# Patient Record
Sex: Male | Born: 1950 | Race: White | Hispanic: No | Marital: Married
Health system: Southern US, Community
[De-identification: ages and names within clinical notes are randomized; demographics above are authoritative.]

---

## 2003-04-02 ENCOUNTER — Ambulatory Visit (HOSPITAL_COMMUNITY): Admission: RE | Admit: 2003-04-02 | Discharge: 2003-04-02 | Payer: Self-pay | Admitting: Gastroenterology

## 2006-03-25 ENCOUNTER — Emergency Department (HOSPITAL_COMMUNITY): Admission: EM | Admit: 2006-03-25 | Discharge: 2006-03-25 | Payer: Self-pay | Admitting: Emergency Medicine

## 2006-06-08 ENCOUNTER — Inpatient Hospital Stay (HOSPITAL_COMMUNITY): Admission: EM | Admit: 2006-06-08 | Discharge: 2006-06-08 | Payer: Self-pay | Admitting: Emergency Medicine

## 2006-06-08 ENCOUNTER — Encounter (INDEPENDENT_AMBULATORY_CARE_PROVIDER_SITE_OTHER): Payer: Self-pay | Admitting: Specialist

## 2008-02-20 IMAGING — US US ABDOMEN COMPLETE
1 series · 14 of 25 positions shown · non-contrast
Comparison: None.

CLINICAL DATA: Abdominal pain. 
 ABDOMEN ULTRASOUND:
TECHNIQUE: Complete abdominal ultrasound examination was performed including evaluation of the liver, gallbladder, bile ducts, pancreas, kidneys, spleen, IVC, and abdominal aorta.

[Series 1: unknown · 0.38mm/px · 14 of 87 slices shown]
[im 1/87]
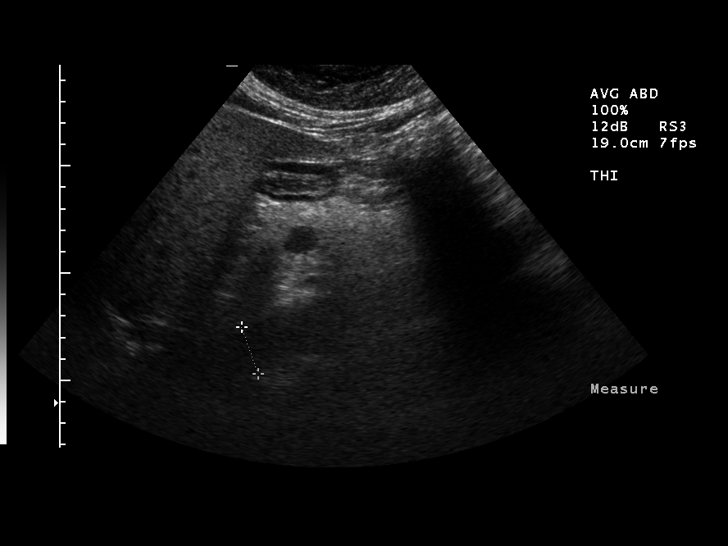
[im 8/87]
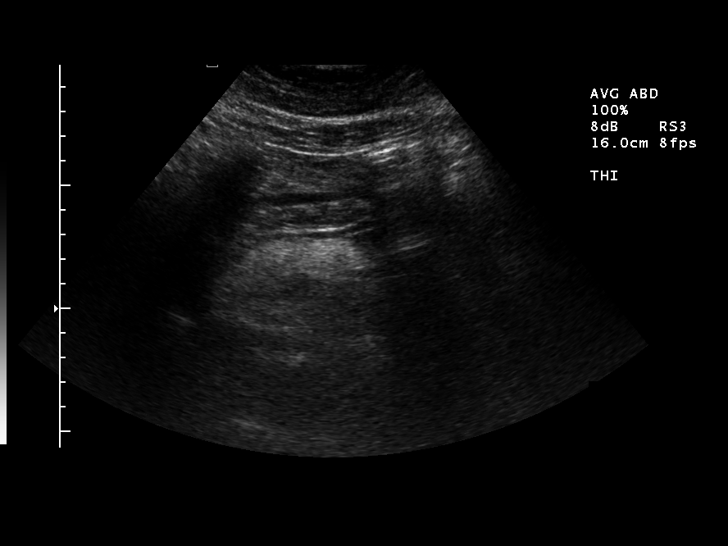
[im 15/87]
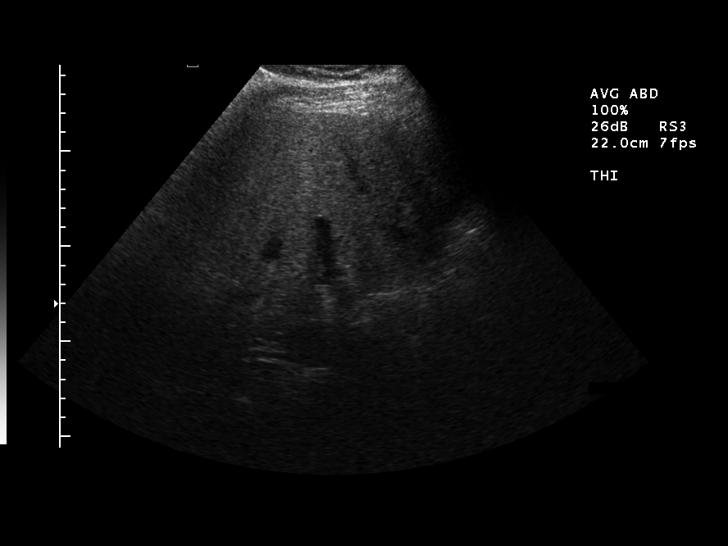
[im 22/87]
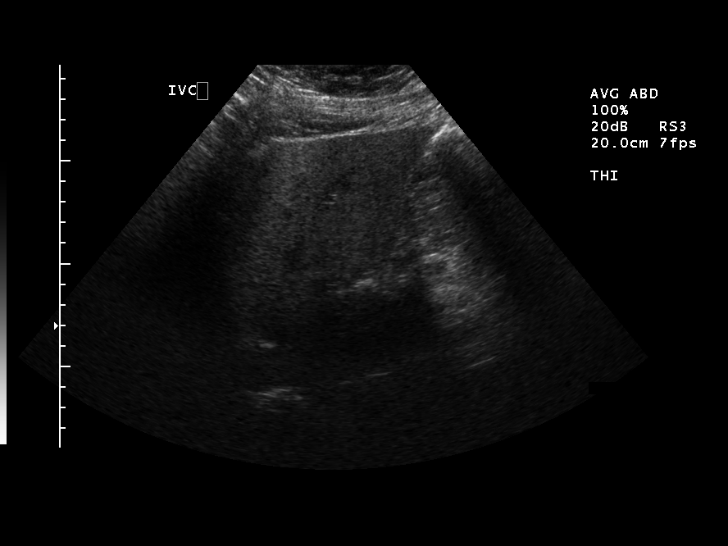
[im 29/87]
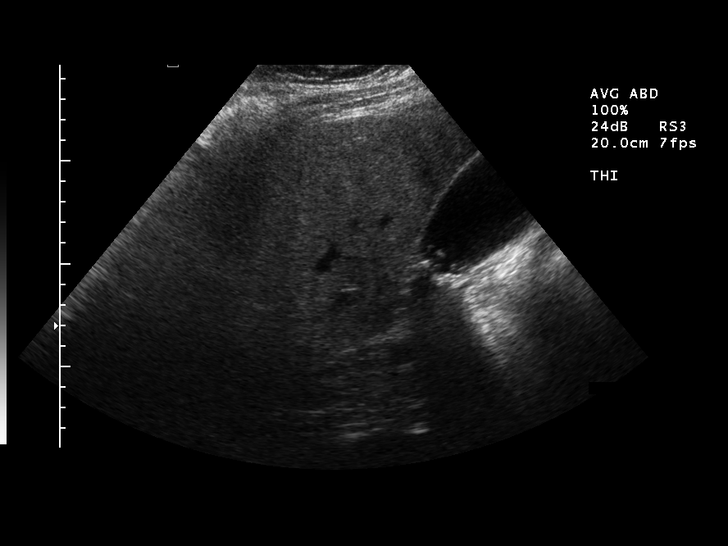
[im 33/87]
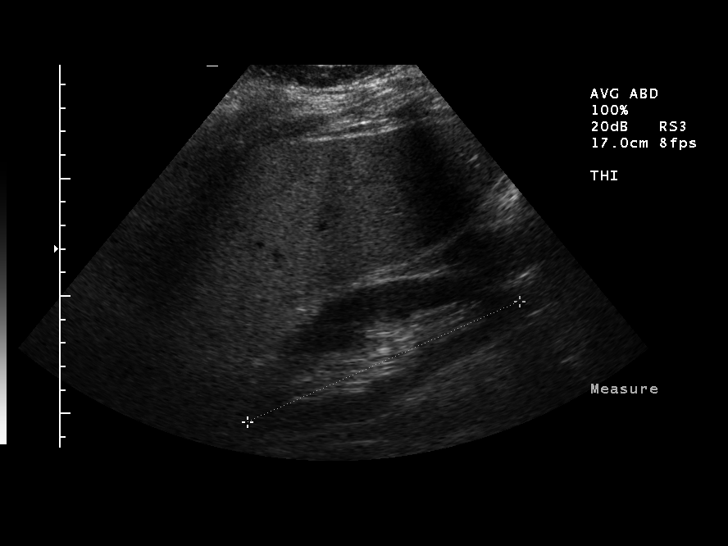
[im 40/87]
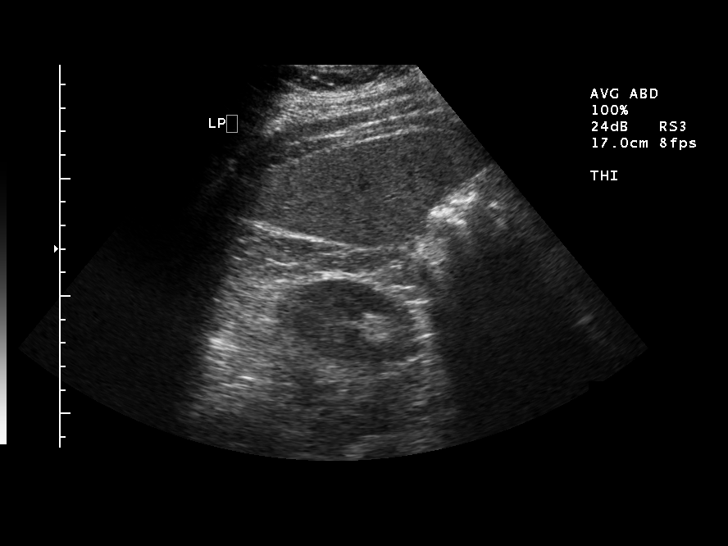
[im 47/87]
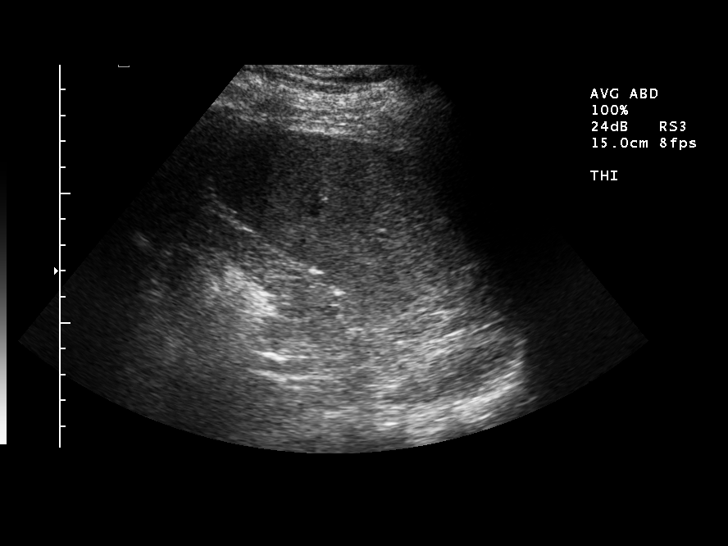
[im 54/87]
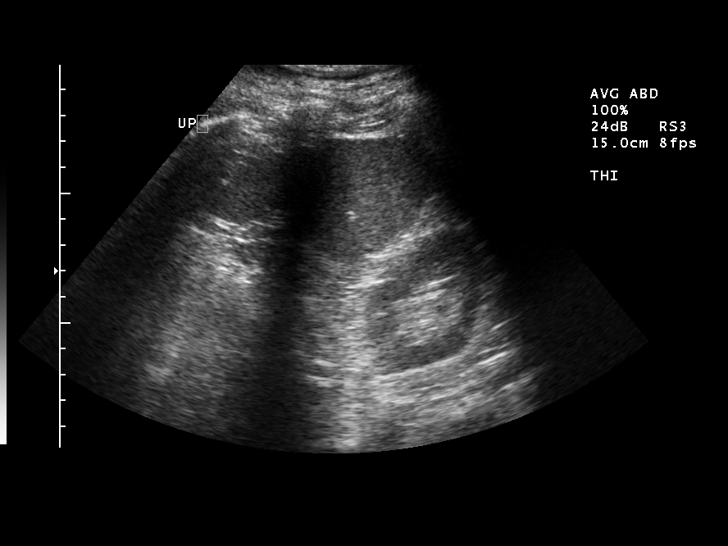
[im 58/87]
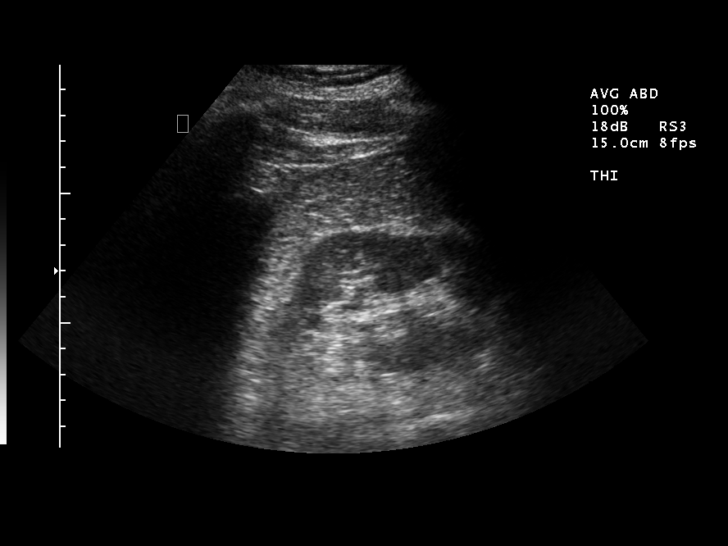
[im 65/87]
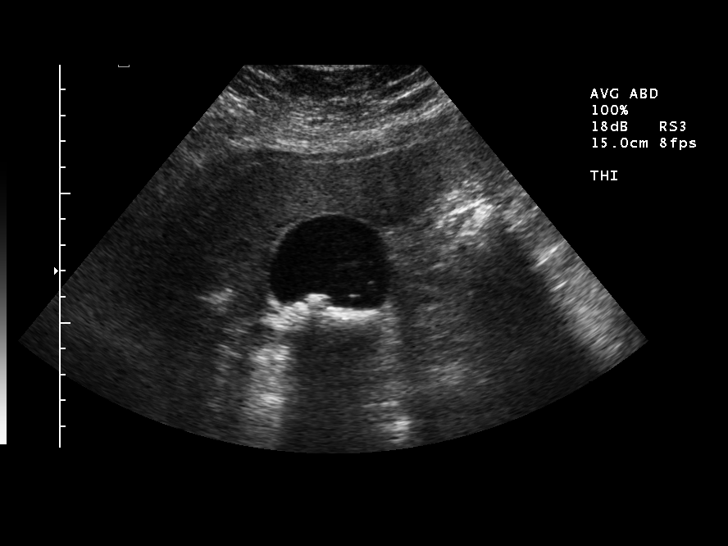
[im 72/87]
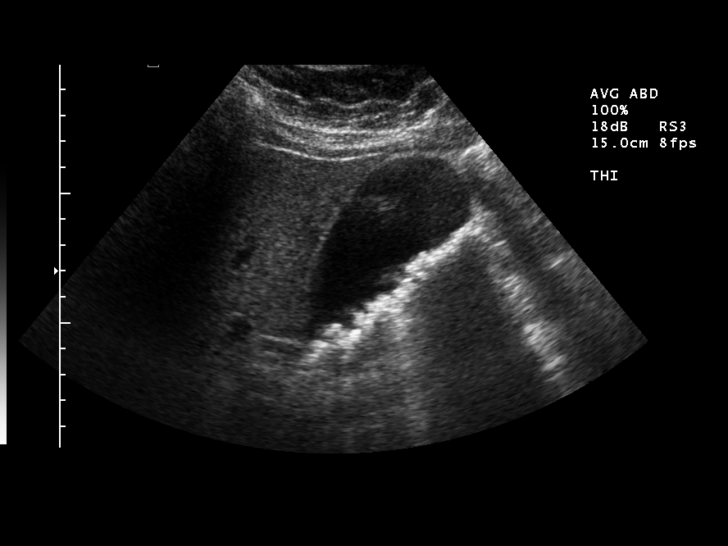
[im 79/87]
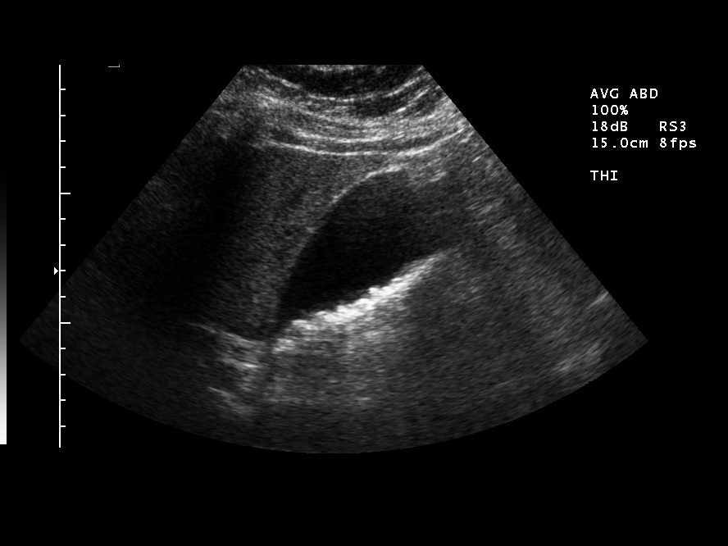
[im 87/87]
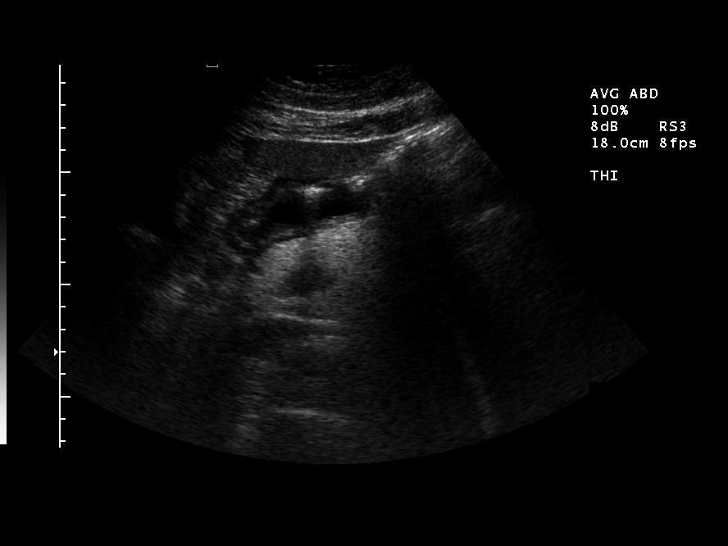

[14 of 25 positions shown; findings below may reference images not displayed]

FINDINGS: There are multiple small gallstones showing mobility and acoustical shadowing.  There is no gallbladder wall thickening or edema.  The patient is, however, subjectively tender over the gallbladder.  No intrahepatic or extrahepatic bile duct dilatation.  The common duct measures 4.4 mm.  The head and body of the pancreas appear normal.  The tail is obscured by overlying bowel gas.  Spleen and kidneys unremarkable.  No ascites.  Aorta and IVC normal.
IMPRESSION: 1.  Cholelithiasis. 
 2.  Tail of pancreas inadequately visualized.

## 2009-05-15 ENCOUNTER — Emergency Department (HOSPITAL_COMMUNITY): Admission: EM | Admit: 2009-05-15 | Discharge: 2009-05-15 | Payer: Self-pay | Admitting: Emergency Medicine

## 2010-05-24 LAB — POCT I-STAT, CHEM 8
Calcium, Ion: 1.12 mmol/L (ref 1.12–1.32)
Chloride: 108 mEq/L (ref 96–112)
Glucose, Bld: 107 mg/dL — ABNORMAL HIGH (ref 70–99)
HCT: 39 % (ref 39.0–52.0)
Potassium: 3.8 mEq/L (ref 3.5–5.1)
Sodium: 139 mEq/L (ref 135–145)

## 2010-07-16 NOTE — Op Note (Signed)
Dave Maxwell, Dave Maxwell                           ACCOUNT NO.:  [id number]   MEDICAL RECORD NO.:  [id number]                   PATIENT TYPE:  AMB   LOCATION:  ENDO                                 FACILITY:  [facility]   PHYSICIAN:  Anselmo Rod, M.D.               DATE OF BIRTH:  [birth year]   DATE OF PROCEDURE:  04/02/2003  DATE OF DISCHARGE:                                 OPERATIVE REPORT   PROCEDURE PERFORMED:  Colonoscopy.   ENDOSCOPIST:  Anselmo Rod, M.D.   INSTRUMENT USED:  Olympus video colonoscope.   INDICATIONS FOR PROCEDURE:  60 year old white male with a history of rectal  bleeding, found to have guaiac positive stool on physical exam in the office  on initial evaluation.  Rule out colonic polyps, masses, etc.   PREPROCEDURE PREPARATION:  Informed consent was procured from the patient.  The patient fasted for eight hours prior to the procedure and prepped with a  bottle of magnesium citrate and a gallon of GoLYTELY the night prior to the  procedure.  The patient was advised to discontinue all nonsteroidals prior  to the procedure.   PREPROCEDURE PHYSICAL:  VITAL SIGNS:  The patient had stable vital signs.  NECK:  Supple.  CHEST:  Clear to auscultation.  S1 and S2 regular.  ABDOMEN: Soft with normal bowel sounds.   DESCRIPTION OF THE PROCEDURE:  The patient was placed in the left lateral  decubitus position, sedated with an additional 10 mg of Demerol and 1 mg of  Versed for the colonoscopy.  The patient had an EGD prior to the  colonoscopy.  Once the patient was adequately sedated and maintained on low-  flow oxygen and continuous cardiac monitoring, the Olympus video colonoscope  was advanced from the rectum to the cecum and terminal ileum without  difficulty.  The appendicular orifice and the ileocecal valve were clearly  visualized and photographed.  A small internal hemorrhoid was seen on  retroflexion.  No masses, polyps, erosions, ulcerations, or diverticula  were  seen.   IMPRESSION:  Normal colonoscopy up to the terminal ileum except for a small  internal hemorrhoid.   RECOMMENDATION:  1. A small bowel follow through has been planned for the patient to complete     the GI evaluation.  Further recommendations made on followup.  2. Continue a high-fiber diet with liberal fluid intake.  3. Avoid nonsteroidals for now.                                               Anselmo Rod, M.D.   JNM/MEDQ  D:  04/02/2003  T:  04/02/2003  Job:  [id number]   cc:   Merilynn Finland, MD

## 2010-07-16 NOTE — Op Note (Signed)
Dave Maxwell, Dave Maxwell               ACCOUNT NO.:  [id number]   MEDICAL RECORD NO.:  [id number]          PATIENT TYPE:  INP   LOCATION:  1507                         FACILITY:  [facility]   PHYSICIAN:  Leonie Man, M.D.   DATE OF BIRTH:  May 05, 1950   DATE OF PROCEDURE:  06/08/2006  DATE OF DISCHARGE:  06/08/2006                               OPERATIVE REPORT   PREOPERATIVE DIAGNOSIS:  Acute cholecystitis.   POSTOPERATIVE DIAGNOSIS:  Acute cholecystitis.   PROCEDURE:  Laparoscopic cholecystectomy with intraoperative  cholangiogram.   SURGEON:  Leonie Man, M.D.   ASSISTANT:  Anselm Pancoast. Zachery Dakins, M.D.   ANESTHESIA:  General.   ESTIMATED BLOOD LOSS:  Minimal.   SPECIMENS TO LAB:  Gallbladder with stones.   COMPLICATIONS:  None apparent.   INDICATIONS:  Dave Maxwell is a 60 year old man admitted to the hospital  after the onset of epigastric and right upper quadrant pain associated  with nausea and vomiting, on evaluation noted to have a leukocytosis of  14,000 and on gallbladder ultrasound noted to have cholelithiasis.  The  patient had no fever but on examination was noted to have an exquisitely  tender right upper quadrant.  CT scans of the abdomen were otherwise  unremarkable.  The patient comes to the operating room with normal liver  function studies.   PROCEDURE:  Following the induction of satisfactory general anesthesia  with the patient positioned supinely, the patient was identified as  Dave Maxwell.  The open laparoscopy was then created at the umbilicus  with insertion of a Hasson cannula and insufflation of the peritoneal  cavity to 14 mmHg pressure using carbon dioxide.  A camera was inserted  and visual exploration of the abdomen carried out.  The exploration  revealed an acutely inflamed and somewhat gangrenous gallbladder.  The  liver edges were sharp, the liver surfaces smooth.  The anterior gastric  wall and duodenal sweep appeared otherwise to be normal.   None of the  small or large intestine viewed appeared to be abnormal.   Under direct vision, epigastric and lateral ports were placed.  The  gallbladder is grasped and the prior to reflecting it cephalad I  aspirated the contents of a tense, hydropic gallbladder.  The  gallbladder then grasped and retracted cephalad and dissection carried  down in the region of the ampulla, where the cystic artery and cystic  duct were isolated, the cystic duct being traced up to the cystic duct-  gallbladder junction and the cystic artery traced to the gallbladder  wall.  The cystic duct was clipped proximally and opened and a cystic  duct cholangiogram was carried out by passing a Cook catheter into the  abdomen and into the cystic duct.  Through this one-half-strength  Renografin was injected under fluoroscopic guidance.  The resulting  cholangiogram showed prompt flow of contrast into the duodenum and  normal-caliber ducts with no filling defects.  The catheter was then  withdrawn and the cystic duct was triply clipped on the stay side and  then transected.  The cystic artery was doubly clipped on the stay side  and then transected.  The gallbladder was then isolated and removed from  the liver bed using electrocautery throughout the entire course of  dissection.  At the end of the dissection all areas within the liver bed  were checked for hemostasis.  Additional bleeding points were treated  with electrocautery.  The gallbladder was freed, then placed in an  Endopouch with the camera in the epigastric position and the gallbladder  was retrieved through the umbilical port without difficulty.  The right  upper quadrant was then thoroughly irrigated with normal saline and  aspirated.  Sponge, instrument and sharp counts were verified.  The  pneumoperitoneum was allowed to deflate and the wounds closed in layers  as follows:  The umbilical wound in two layers with 0 Vicryl and 4-0  Monocryl, epigastric  and flank wounds closed with 4-0 Monocryl sutures.  All wounds were reinforced with Steri-Strips, sterile dressings applied,  the anesthetic reversed, and the patient removed from the operating room  to the recovery room in stable condition.  He tolerated the procedure  well.      Leonie Man, M.D.  Electronically Signed     PB/MEDQ  D:  06/09/2006  T:  06/09/2006  Job:  [id number]

## 2010-07-16 NOTE — Op Note (Signed)
NAMEEYDEN, DOBIE                           ACCOUNT NO.:  [id number]   MEDICAL RECORD NO.:  [id number]                   PATIENT TYPE:  AMB   LOCATION:  ENDO                                 FACILITY:  [facility]   PHYSICIAN:  Anselmo Rod, M.D.               DATE OF BIRTH:  04-30-1950   DATE OF PROCEDURE:  04/02/2003  DATE OF DISCHARGE:                                 OPERATIVE REPORT   PROCEDURE PERFORMED:  Esophagogastroduodenoscopy.   ENDOSCOPIST:  Charna Elizabeth, M.D.   INSTRUMENT USED:  Olympus video panendoscope.   INDICATIONS FOR PROCEDURE:  The patient is a 60 year old white male with a  history of rectal bleeding.  The patient has also had guaiac positive stool  on a recent physical exam in the office.  Rule out peptic ulcer disease,  esophagitis, gastritis, etc.   PREPROCEDURE PREPARATION:  Informed consent was procured from the patient.  The patient was fasted for eight hours prior to the procedure.   PREPROCEDURE PHYSICAL:  The patient had stable vital signs.  Neck supple,  chest clear to auscultation.  S1, S2 regular.  Abdomen soft with normal  bowel sounds.   DESCRIPTION OF PROCEDURE:  The patient was placed in the left lateral  decubitus position and sedated with 60 mg of Demerol and 6 mg of Versed  intravenously.  Once the patient was adequately sedated and maintained on  low-flow oxygen and continuous cardiac monitoring, the Olympus video  panendoscope was advanced through the mouth piece over the tongue into the  esophagus under direct vision.  The entire esophagus appeared normal with no  evidence of ring, stricture, masses, esophagitis or Barrett's mucosa.  The  scope was then advanced to the stomach.  The entire gastric mucosa appeared  normal.  Retroflexion in the high cardia revealed no abnormalities. There  was mild duodenitis in the duodenal bulb, small bowel distal to the bulb  appeared normal.  There was no outlet obstruction.   IMPRESSION:  1. Normal  esophagogastroduodenoscopy except for mild duodenitis in the     duodenal bulb.  2. No ulcers, erosions, masses or polyps seen.   RECOMMENDATIONS:  1. Proceed with a colonoscopy at this time, avoid nonsteroidals.  2. Further recommendations to be made after colonoscopy has been done.                                               Anselmo Rod, M.D.    JNM/MEDQ  D:  04/02/2003  T:  04/02/2003  Job:  [id number]   cc:   Birdena Jubilee, MD  [address]  [place]  [place] [address]  Fax: [phone]
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
## 2018-01-03 DIAGNOSIS — R350 Frequency of micturition: Secondary | ICD-10-CM | POA: Diagnosis not present

## 2018-01-03 DIAGNOSIS — Z125 Encounter for screening for malignant neoplasm of prostate: Secondary | ICD-10-CM | POA: Diagnosis not present

## 2018-01-03 DIAGNOSIS — Z6841 Body Mass Index (BMI) 40.0 and over, adult: Secondary | ICD-10-CM | POA: Diagnosis not present

## 2018-01-03 DIAGNOSIS — Z0000 Encounter for general adult medical examination without abnormal findings: Secondary | ICD-10-CM | POA: Diagnosis not present

## 2018-01-03 DIAGNOSIS — G4730 Sleep apnea, unspecified: Secondary | ICD-10-CM | POA: Diagnosis not present

## 2018-01-03 DIAGNOSIS — Z1159 Encounter for screening for other viral diseases: Secondary | ICD-10-CM | POA: Diagnosis not present

## 2018-01-03 DIAGNOSIS — E7800 Pure hypercholesterolemia, unspecified: Secondary | ICD-10-CM | POA: Diagnosis not present

## 2018-01-03 DIAGNOSIS — E039 Hypothyroidism, unspecified: Secondary | ICD-10-CM | POA: Diagnosis not present

## 2018-01-03 DIAGNOSIS — I10 Essential (primary) hypertension: Secondary | ICD-10-CM | POA: Diagnosis not present

## 2018-02-12 DIAGNOSIS — R350 Frequency of micturition: Secondary | ICD-10-CM | POA: Diagnosis not present

## 2018-02-12 DIAGNOSIS — N401 Enlarged prostate with lower urinary tract symptoms: Secondary | ICD-10-CM | POA: Diagnosis not present

## 2018-03-21 DIAGNOSIS — R0681 Apnea, not elsewhere classified: Secondary | ICD-10-CM | POA: Diagnosis not present

## 2018-04-11 DIAGNOSIS — G4733 Obstructive sleep apnea (adult) (pediatric): Secondary | ICD-10-CM | POA: Diagnosis not present

## 2018-05-14 DIAGNOSIS — N401 Enlarged prostate with lower urinary tract symptoms: Secondary | ICD-10-CM | POA: Diagnosis not present

## 2018-05-14 DIAGNOSIS — R350 Frequency of micturition: Secondary | ICD-10-CM | POA: Diagnosis not present

## 2018-07-09 DIAGNOSIS — G4733 Obstructive sleep apnea (adult) (pediatric): Secondary | ICD-10-CM | POA: Diagnosis not present

## 2018-08-08 DIAGNOSIS — E7800 Pure hypercholesterolemia, unspecified: Secondary | ICD-10-CM | POA: Diagnosis not present

## 2018-08-08 DIAGNOSIS — E039 Hypothyroidism, unspecified: Secondary | ICD-10-CM | POA: Diagnosis not present

## 2018-08-08 DIAGNOSIS — I10 Essential (primary) hypertension: Secondary | ICD-10-CM | POA: Diagnosis not present

## 2018-08-08 DIAGNOSIS — G4733 Obstructive sleep apnea (adult) (pediatric): Secondary | ICD-10-CM | POA: Diagnosis not present

## 2018-08-08 DIAGNOSIS — Z23 Encounter for immunization: Secondary | ICD-10-CM | POA: Diagnosis not present

## 2018-11-15 DIAGNOSIS — R0902 Hypoxemia: Secondary | ICD-10-CM | POA: Diagnosis not present

## 2019-01-29 DIAGNOSIS — E039 Hypothyroidism, unspecified: Secondary | ICD-10-CM | POA: Diagnosis not present

## 2019-01-29 DIAGNOSIS — E7800 Pure hypercholesterolemia, unspecified: Secondary | ICD-10-CM | POA: Diagnosis not present

## 2019-01-29 DIAGNOSIS — Z0000 Encounter for general adult medical examination without abnormal findings: Secondary | ICD-10-CM | POA: Diagnosis not present

## 2019-01-29 DIAGNOSIS — I10 Essential (primary) hypertension: Secondary | ICD-10-CM | POA: Diagnosis not present

## 2019-01-29 DIAGNOSIS — Z6841 Body Mass Index (BMI) 40.0 and over, adult: Secondary | ICD-10-CM | POA: Diagnosis not present

## 2019-04-14 ENCOUNTER — Ambulatory Visit: Payer: Self-pay | Attending: Internal Medicine

## 2019-04-14 DIAGNOSIS — Z23 Encounter for immunization: Secondary | ICD-10-CM | POA: Insufficient documentation

## 2019-04-14 NOTE — Progress Notes (Signed)
   Covid-19 Vaccination Clinic  Name:  Dave Maxwell    MRN: [id number] DOB: [birth year]  04/14/2019  Dave Maxwell was observed post Covid-19 immunization for 15 minutes without incidence. He was provided with Vaccine Information Sheet and instruction to access the V-Safe system.   Dave Maxwell was instructed to call 911 with any severe reactions post vaccine: Marland Kitchen Difficulty breathing  . Swelling of your face and throat  . A fast heartbeat  . A bad rash all over your body  . Dizziness and weakness    Immunizations Administered    Name Date Dose VIS Date Route   Pfizer COVID-19 Vaccine 04/14/2019 10:12 AM 0.3 mL 02/08/2019 Intramuscular   Manufacturer: [employer], [employer]   Lot: [id number]   NDC: [address]-2

## 2019-05-07 ENCOUNTER — Ambulatory Visit: Payer: Medicare Other | Attending: Internal Medicine

## 2019-05-07 DIAGNOSIS — Z23 Encounter for immunization: Secondary | ICD-10-CM

## 2019-05-07 NOTE — Progress Notes (Signed)
   Covid-19 Vaccination Clinic  Name:  Dave Maxwell    MRN: [id number] DOB: 08-20-1950  05/07/2019  [name] was observed post Covid-19 immunization for 15 minutes without incident. He was provided with Vaccine Information Sheet and instruction to access the V-Safe system.   [name] was instructed to call 911 with any severe reactions post vaccine: Marland Kitchen Difficulty breathing  . Swelling of face and throat  . A fast heartbeat  . A bad rash all over body  . Dizziness and weakness   Immunizations Administered    Name Date Dose VIS Date Route   Pfizer COVID-19 Vaccine 05/07/2019 11:59 AM 0.3 mL 02/08/2019 Intramuscular   Manufacturer: [employer], [employer]   Lot: [id number]   NDC: [address]-9

## 2019-07-15 DIAGNOSIS — G4733 Obstructive sleep apnea (adult) (pediatric): Secondary | ICD-10-CM | POA: Diagnosis not present

## 2020-09-08 DIAGNOSIS — Z6841 Body Mass Index (BMI) 40.0 and over, adult: Secondary | ICD-10-CM | POA: Diagnosis not present

## 2020-09-08 DIAGNOSIS — Z125 Encounter for screening for malignant neoplasm of prostate: Secondary | ICD-10-CM | POA: Diagnosis not present

## 2020-09-08 DIAGNOSIS — I10 Essential (primary) hypertension: Secondary | ICD-10-CM | POA: Diagnosis not present

## 2020-09-08 DIAGNOSIS — E039 Hypothyroidism, unspecified: Secondary | ICD-10-CM | POA: Diagnosis not present

## 2020-09-08 DIAGNOSIS — E7800 Pure hypercholesterolemia, unspecified: Secondary | ICD-10-CM | POA: Diagnosis not present

## 2020-09-08 DIAGNOSIS — G4733 Obstructive sleep apnea (adult) (pediatric): Secondary | ICD-10-CM | POA: Diagnosis not present

## 2020-09-09 DIAGNOSIS — H26492 Other secondary cataract, left eye: Secondary | ICD-10-CM | POA: Diagnosis not present

## 2020-09-09 DIAGNOSIS — Z961 Presence of intraocular lens: Secondary | ICD-10-CM | POA: Diagnosis not present

## 2020-09-09 DIAGNOSIS — H2511 Age-related nuclear cataract, right eye: Secondary | ICD-10-CM | POA: Diagnosis not present

## 2021-04-07 DIAGNOSIS — E039 Hypothyroidism, unspecified: Secondary | ICD-10-CM | POA: Diagnosis not present

## 2021-04-07 DIAGNOSIS — Z6841 Body Mass Index (BMI) 40.0 and over, adult: Secondary | ICD-10-CM | POA: Diagnosis not present

## 2021-04-07 DIAGNOSIS — I10 Essential (primary) hypertension: Secondary | ICD-10-CM | POA: Diagnosis not present

## 2021-04-07 DIAGNOSIS — G4733 Obstructive sleep apnea (adult) (pediatric): Secondary | ICD-10-CM | POA: Diagnosis not present

## 2021-04-07 DIAGNOSIS — E7800 Pure hypercholesterolemia, unspecified: Secondary | ICD-10-CM | POA: Diagnosis not present

## 2021-07-15 DIAGNOSIS — E039 Hypothyroidism, unspecified: Secondary | ICD-10-CM | POA: Diagnosis not present

## 2021-09-14 DIAGNOSIS — H26492 Other secondary cataract, left eye: Secondary | ICD-10-CM | POA: Diagnosis not present

## 2021-09-14 DIAGNOSIS — Z961 Presence of intraocular lens: Secondary | ICD-10-CM | POA: Diagnosis not present

## 2021-09-14 DIAGNOSIS — H2511 Age-related nuclear cataract, right eye: Secondary | ICD-10-CM | POA: Diagnosis not present

## 2021-10-14 DIAGNOSIS — E039 Hypothyroidism, unspecified: Secondary | ICD-10-CM | POA: Diagnosis not present

## 2021-10-26 DIAGNOSIS — E039 Hypothyroidism, unspecified: Secondary | ICD-10-CM | POA: Diagnosis not present

## 2021-10-26 DIAGNOSIS — G4733 Obstructive sleep apnea (adult) (pediatric): Secondary | ICD-10-CM | POA: Diagnosis not present

## 2021-10-26 DIAGNOSIS — E7800 Pure hypercholesterolemia, unspecified: Secondary | ICD-10-CM | POA: Diagnosis not present

## 2021-10-26 DIAGNOSIS — I10 Essential (primary) hypertension: Secondary | ICD-10-CM | POA: Diagnosis not present

## 2021-10-26 DIAGNOSIS — Z79899 Other long term (current) drug therapy: Secondary | ICD-10-CM | POA: Diagnosis not present

## 2021-10-26 DIAGNOSIS — Z6841 Body Mass Index (BMI) 40.0 and over, adult: Secondary | ICD-10-CM | POA: Diagnosis not present

## 2022-04-20 DIAGNOSIS — E039 Hypothyroidism, unspecified: Secondary | ICD-10-CM | POA: Diagnosis not present

## 2022-04-20 DIAGNOSIS — I10 Essential (primary) hypertension: Secondary | ICD-10-CM | POA: Diagnosis not present

## 2022-04-20 DIAGNOSIS — E7800 Pure hypercholesterolemia, unspecified: Secondary | ICD-10-CM | POA: Diagnosis not present

## 2022-04-20 DIAGNOSIS — R7309 Other abnormal glucose: Secondary | ICD-10-CM | POA: Diagnosis not present

## 2022-04-28 DIAGNOSIS — I10 Essential (primary) hypertension: Secondary | ICD-10-CM | POA: Diagnosis not present

## 2022-04-28 DIAGNOSIS — E039 Hypothyroidism, unspecified: Secondary | ICD-10-CM | POA: Diagnosis not present

## 2022-04-28 DIAGNOSIS — G4733 Obstructive sleep apnea (adult) (pediatric): Secondary | ICD-10-CM | POA: Diagnosis not present

## 2022-04-28 DIAGNOSIS — E7800 Pure hypercholesterolemia, unspecified: Secondary | ICD-10-CM | POA: Diagnosis not present

## 2022-04-28 DIAGNOSIS — Z0000 Encounter for general adult medical examination without abnormal findings: Secondary | ICD-10-CM | POA: Diagnosis not present

## 2022-04-28 DIAGNOSIS — M159 Polyosteoarthritis, unspecified: Secondary | ICD-10-CM | POA: Diagnosis not present

## 2022-04-28 DIAGNOSIS — E1169 Type 2 diabetes mellitus with other specified complication: Secondary | ICD-10-CM | POA: Diagnosis not present

## 2022-08-08 DIAGNOSIS — E039 Hypothyroidism, unspecified: Secondary | ICD-10-CM | POA: Diagnosis not present

## 2022-08-08 DIAGNOSIS — E1169 Type 2 diabetes mellitus with other specified complication: Secondary | ICD-10-CM | POA: Diagnosis not present

## 2022-08-08 DIAGNOSIS — I10 Essential (primary) hypertension: Secondary | ICD-10-CM | POA: Diagnosis not present

## 2022-08-08 DIAGNOSIS — E7800 Pure hypercholesterolemia, unspecified: Secondary | ICD-10-CM | POA: Diagnosis not present

## 2022-08-11 DIAGNOSIS — I10 Essential (primary) hypertension: Secondary | ICD-10-CM | POA: Diagnosis not present

## 2022-08-11 DIAGNOSIS — Z6841 Body Mass Index (BMI) 40.0 and over, adult: Secondary | ICD-10-CM | POA: Diagnosis not present

## 2022-08-11 DIAGNOSIS — E1165 Type 2 diabetes mellitus with hyperglycemia: Secondary | ICD-10-CM | POA: Diagnosis not present

## 2022-08-11 DIAGNOSIS — E7800 Pure hypercholesterolemia, unspecified: Secondary | ICD-10-CM | POA: Diagnosis not present

## 2022-08-11 DIAGNOSIS — E1169 Type 2 diabetes mellitus with other specified complication: Secondary | ICD-10-CM | POA: Diagnosis not present

## 2022-08-11 DIAGNOSIS — E039 Hypothyroidism, unspecified: Secondary | ICD-10-CM | POA: Diagnosis not present

## 2022-08-18 DIAGNOSIS — E1165 Type 2 diabetes mellitus with hyperglycemia: Secondary | ICD-10-CM | POA: Diagnosis not present

## 2022-09-09 DIAGNOSIS — B029 Zoster without complications: Secondary | ICD-10-CM | POA: Diagnosis not present

## 2022-09-20 DIAGNOSIS — Z6841 Body Mass Index (BMI) 40.0 and over, adult: Secondary | ICD-10-CM | POA: Diagnosis not present

## 2022-09-20 DIAGNOSIS — E1165 Type 2 diabetes mellitus with hyperglycemia: Secondary | ICD-10-CM | POA: Diagnosis not present

## 2022-09-20 DIAGNOSIS — I10 Essential (primary) hypertension: Secondary | ICD-10-CM | POA: Diagnosis not present

## 2022-09-20 DIAGNOSIS — B029 Zoster without complications: Secondary | ICD-10-CM | POA: Diagnosis not present

## 2022-10-10 DIAGNOSIS — Z961 Presence of intraocular lens: Secondary | ICD-10-CM | POA: Diagnosis not present

## 2022-10-10 DIAGNOSIS — H2511 Age-related nuclear cataract, right eye: Secondary | ICD-10-CM | POA: Diagnosis not present

## 2022-10-10 DIAGNOSIS — H26492 Other secondary cataract, left eye: Secondary | ICD-10-CM | POA: Diagnosis not present

## 2022-11-14 DIAGNOSIS — E1165 Type 2 diabetes mellitus with hyperglycemia: Secondary | ICD-10-CM | POA: Diagnosis not present

## 2022-11-14 DIAGNOSIS — I10 Essential (primary) hypertension: Secondary | ICD-10-CM | POA: Diagnosis not present

## 2022-11-17 DIAGNOSIS — E7800 Pure hypercholesterolemia, unspecified: Secondary | ICD-10-CM | POA: Diagnosis not present

## 2022-11-17 DIAGNOSIS — E1165 Type 2 diabetes mellitus with hyperglycemia: Secondary | ICD-10-CM | POA: Diagnosis not present

## 2022-11-17 DIAGNOSIS — Z6841 Body Mass Index (BMI) 40.0 and over, adult: Secondary | ICD-10-CM | POA: Diagnosis not present

## 2022-11-17 DIAGNOSIS — I10 Essential (primary) hypertension: Secondary | ICD-10-CM | POA: Diagnosis not present

## 2022-11-17 DIAGNOSIS — E039 Hypothyroidism, unspecified: Secondary | ICD-10-CM | POA: Diagnosis not present

## 2023-05-11 DIAGNOSIS — E1165 Type 2 diabetes mellitus with hyperglycemia: Secondary | ICD-10-CM | POA: Diagnosis not present

## 2023-05-11 DIAGNOSIS — I10 Essential (primary) hypertension: Secondary | ICD-10-CM | POA: Diagnosis not present

## 2023-05-11 DIAGNOSIS — E039 Hypothyroidism, unspecified: Secondary | ICD-10-CM | POA: Diagnosis not present

## 2023-05-15 DIAGNOSIS — G4733 Obstructive sleep apnea (adult) (pediatric): Secondary | ICD-10-CM | POA: Diagnosis not present

## 2023-05-18 DIAGNOSIS — E7800 Pure hypercholesterolemia, unspecified: Secondary | ICD-10-CM | POA: Diagnosis not present

## 2023-05-18 DIAGNOSIS — G4733 Obstructive sleep apnea (adult) (pediatric): Secondary | ICD-10-CM | POA: Diagnosis not present

## 2023-05-18 DIAGNOSIS — Z6841 Body Mass Index (BMI) 40.0 and over, adult: Secondary | ICD-10-CM | POA: Diagnosis not present

## 2023-05-18 DIAGNOSIS — I10 Essential (primary) hypertension: Secondary | ICD-10-CM | POA: Diagnosis not present

## 2023-05-18 DIAGNOSIS — E039 Hypothyroidism, unspecified: Secondary | ICD-10-CM | POA: Diagnosis not present

## 2023-05-18 DIAGNOSIS — E1165 Type 2 diabetes mellitus with hyperglycemia: Secondary | ICD-10-CM | POA: Diagnosis not present

## 2023-09-19 DIAGNOSIS — E1165 Type 2 diabetes mellitus with hyperglycemia: Secondary | ICD-10-CM | POA: Diagnosis not present

## 2023-09-19 DIAGNOSIS — I10 Essential (primary) hypertension: Secondary | ICD-10-CM | POA: Diagnosis not present

## 2023-09-19 DIAGNOSIS — Z125 Encounter for screening for malignant neoplasm of prostate: Secondary | ICD-10-CM | POA: Diagnosis not present

## 2023-09-19 DIAGNOSIS — Z6841 Body Mass Index (BMI) 40.0 and over, adult: Secondary | ICD-10-CM | POA: Diagnosis not present

## 2023-09-19 DIAGNOSIS — Z23 Encounter for immunization: Secondary | ICD-10-CM | POA: Diagnosis not present

## 2023-09-19 DIAGNOSIS — Z1211 Encounter for screening for malignant neoplasm of colon: Secondary | ICD-10-CM | POA: Diagnosis not present

## 2023-09-19 DIAGNOSIS — Z1331 Encounter for screening for depression: Secondary | ICD-10-CM | POA: Diagnosis not present

## 2023-09-19 DIAGNOSIS — E7800 Pure hypercholesterolemia, unspecified: Secondary | ICD-10-CM | POA: Diagnosis not present

## 2023-09-19 DIAGNOSIS — Z0000 Encounter for general adult medical examination without abnormal findings: Secondary | ICD-10-CM | POA: Diagnosis not present

## 2023-10-11 DIAGNOSIS — H2511 Age-related nuclear cataract, right eye: Secondary | ICD-10-CM | POA: Diagnosis not present

## 2023-10-11 DIAGNOSIS — H26492 Other secondary cataract, left eye: Secondary | ICD-10-CM | POA: Diagnosis not present

## 2023-10-11 DIAGNOSIS — Z961 Presence of intraocular lens: Secondary | ICD-10-CM | POA: Diagnosis not present

## 2023-11-09 DIAGNOSIS — R0689 Other abnormalities of breathing: Secondary | ICD-10-CM | POA: Diagnosis not present

## 2023-11-09 DIAGNOSIS — I10 Essential (primary) hypertension: Secondary | ICD-10-CM | POA: Diagnosis not present

## 2023-11-09 DIAGNOSIS — G4733 Obstructive sleep apnea (adult) (pediatric): Secondary | ICD-10-CM | POA: Diagnosis not present

## 2023-11-21 DIAGNOSIS — E039 Hypothyroidism, unspecified: Secondary | ICD-10-CM | POA: Diagnosis not present

## 2023-11-21 DIAGNOSIS — E1165 Type 2 diabetes mellitus with hyperglycemia: Secondary | ICD-10-CM | POA: Diagnosis not present

## 2023-11-21 DIAGNOSIS — Z6841 Body Mass Index (BMI) 40.0 and over, adult: Secondary | ICD-10-CM | POA: Diagnosis not present

## 2023-11-27 DIAGNOSIS — H26492 Other secondary cataract, left eye: Secondary | ICD-10-CM | POA: Diagnosis not present

## 2024-01-23 DIAGNOSIS — G4733 Obstructive sleep apnea (adult) (pediatric): Secondary | ICD-10-CM | POA: Diagnosis not present
